# Patient Record
Sex: Male | Born: 1975 | Race: White | Hispanic: No | Marital: Single | Smoking: Never smoker
Health system: Southern US, Community
[De-identification: ages and names within clinical notes are randomized; demographics above are authoritative.]

---

## 2020-12-08 ENCOUNTER — Ambulatory Visit (INDEPENDENT_AMBULATORY_CARE_PROVIDER_SITE_OTHER): Payer: 59 | Admitting: Podiatry

## 2020-12-08 ENCOUNTER — Ambulatory Visit (INDEPENDENT_AMBULATORY_CARE_PROVIDER_SITE_OTHER): Payer: 59

## 2020-12-08 ENCOUNTER — Encounter: Payer: Self-pay | Admitting: Podiatry

## 2020-12-08 ENCOUNTER — Other Ambulatory Visit: Payer: Self-pay

## 2020-12-08 DIAGNOSIS — M21619 Bunion of unspecified foot: Secondary | ICD-10-CM | POA: Diagnosis not present

## 2020-12-08 DIAGNOSIS — M79671 Pain in right foot: Secondary | ICD-10-CM

## 2020-12-08 DIAGNOSIS — M7672 Peroneal tendinitis, left leg: Secondary | ICD-10-CM | POA: Diagnosis not present

## 2020-12-08 DIAGNOSIS — M25572 Pain in left ankle and joints of left foot: Secondary | ICD-10-CM

## 2020-12-08 DIAGNOSIS — M79672 Pain in left foot: Secondary | ICD-10-CM

## 2020-12-08 MED ORDER — TRIAMCINOLONE ACETONIDE 10 MG/ML IJ SUSP
10.0000 mg | Freq: Once | INTRAMUSCULAR | Status: AC
  Administered 2020-12-08: 10 mg

## 2020-12-11 NOTE — Progress Notes (Signed)
Subjective:   Patient ID: Casey Vaughan, male   DOB: 45 y.o.   MRN: [id number]   HPI Patient states that he injured his left ankle approximately 1 year ago and that it is gotten worse over the last couple months but is lingered since then.  Also states that he seems to get discomfort around his big toe joint and that its been somewhat present for the last 3 to 4 years.  It is somewhat vague in its intensity and patient does not smoke likes to be active   Review of Systems  All other systems reviewed and are negative.       Objective:  Physical Exam Vitals and nursing note reviewed.  Constitutional:      Appearance: He is well-developed and well-nourished.  Cardiovascular:     Pulses: Intact distal pulses.  Pulmonary:     Effort: Pulmonary effort is normal.  Musculoskeletal:        General: Normal range of motion.  Skin:    General: Skin is warm.  Neurological:     Mental Status: He is alert.   Neurovascular status found to be intact muscle strength was adequate range of motion adequate with patient noted to have inflammation lateral side left foot base of fifth metatarsal with fluid buildup around the insertion with no restriction motion first MPJ bilateral mild bunion deformity bilateral.  Patient is found to have good digital perfusion well oriented x3      Assessment:  Peroneal tendinitis left along with mild structural bunion deformity bilateral with chronic foot pain     Plan:  H&P condition reviewed and at this point we will get a focus on the acute inflammation lateral side left foot.  I did sterile prep and I injected the peroneal tendon insertion 3 mg Dexasone Kenalog 5 mg Xylocaine applied fascial brace to lift up the lateral foot gave instructions for ice and anti-inflammatories the patient will be seen back to recheck  X-rays were negative for signs of fracture did not indicate bone pathology at this time appears soft tissue with no indications of first MPJ arthritis  with mild bunion deformity bilateral

## 2021-01-08 ENCOUNTER — Encounter (HOSPITAL_COMMUNITY): Payer: Self-pay

## 2021-01-08 ENCOUNTER — Emergency Department (HOSPITAL_COMMUNITY): Payer: 59

## 2021-01-08 ENCOUNTER — Emergency Department (HOSPITAL_COMMUNITY)
Admission: EM | Admit: 2021-01-08 | Discharge: 2021-01-08 | Disposition: A | Discharge status: Home / Self Care | Payer: 59 | Attending: Emergency Medicine | Admitting: Emergency Medicine

## 2021-01-08 ENCOUNTER — Other Ambulatory Visit: Payer: Self-pay

## 2021-01-08 DIAGNOSIS — F419 Anxiety disorder, unspecified: Secondary | ICD-10-CM | POA: Insufficient documentation

## 2021-01-08 DIAGNOSIS — R079 Chest pain, unspecified: Secondary | ICD-10-CM | POA: Diagnosis present

## 2021-01-08 DIAGNOSIS — R0789 Other chest pain: Secondary | ICD-10-CM | POA: Insufficient documentation

## 2021-01-08 DIAGNOSIS — R0602 Shortness of breath: Secondary | ICD-10-CM | POA: Insufficient documentation

## 2021-01-08 DIAGNOSIS — R002 Palpitations: Secondary | ICD-10-CM | POA: Insufficient documentation

## 2021-01-08 LAB — HEPATIC FUNCTION PANEL
ALT: 49 U/L — ABNORMAL HIGH (ref 0–44)
AST: 30 U/L (ref 15–41)
Albumin: 3.9 g/dL (ref 3.5–5.0)
Alkaline Phosphatase: 91 U/L (ref 38–126)
Bilirubin, Direct: 0.1 mg/dL (ref 0.0–0.2)
Indirect Bilirubin: 0.7 mg/dL (ref 0.3–0.9)
Total Bilirubin: 0.8 mg/dL (ref 0.3–1.2)
Total Protein: 6.9 g/dL (ref 6.5–8.1)

## 2021-01-08 LAB — BASIC METABOLIC PANEL
Anion gap: 7 (ref 5–15)
BUN: 10 mg/dL (ref 6–20)
CO2: 26 mmol/L (ref 22–32)
Calcium: 9.3 mg/dL (ref 8.9–10.3)
Chloride: 102 mmol/L (ref 98–111)
Creatinine, Ser: 0.86 mg/dL (ref 0.61–1.24)
GFR, Estimated: >60 mL/min (ref >60)
Glucose, Bld: 176 mg/dL — ABNORMAL HIGH (ref 70–99)
Potassium: 3.3 mmol/L — ABNORMAL LOW (ref 3.5–5.1)
Sodium: 135 mmol/L (ref 135–145)

## 2021-01-08 LAB — CBC
HCT: 42.9 % (ref 39.0–52.0)
Hemoglobin: 14.7 g/dL (ref 13.0–17.0)
MCH: 29.9 pg (ref 26.0–34.0)
MCHC: 34.3 g/dL (ref 30.0–36.0)
MCV: 87.2 fL (ref 80.0–100.0)
Platelets: 257 10*3/uL (ref 150–400)
RBC: 4.92 MIL/uL (ref 4.22–5.81)
RDW: 12.7 % (ref 11.5–15.5)
WBC: 9.6 10*3/uL (ref 4.0–10.5)
nRBC: 0 % (ref 0.0–0.2)

## 2021-01-08 LAB — LIPASE, BLOOD: Lipase: 31 U/L (ref 11–51)

## 2021-01-08 LAB — TROPONIN I (HIGH SENSITIVITY)
Troponin I (High Sensitivity): 3 ng/L (ref <18)
Troponin I (High Sensitivity): 3 ng/L (ref <18)

## 2021-01-08 LAB — D-DIMER, QUANTITATIVE: D-Dimer, Quant: <0.27 ug/mL-FEU (ref 0.00–0.50)

## 2021-01-08 MED ORDER — HYDROXYZINE HCL 25 MG PO TABS
25.0000 mg | ORAL_TABLET | Freq: Once | ORAL | Status: AC
  Administered 2021-01-08: 25 mg via ORAL
  Filled 2021-01-08: qty 1

## 2021-01-08 MED ORDER — DOXYCYCLINE HYCLATE 100 MG PO CAPS: 100.0000 mg | ORAL_CAPSULE | Freq: Two times a day (BID) | ORAL | 0 refills | Status: AC

## 2021-01-08 MED ORDER — DOXYCYCLINE HYCLATE 100 MG PO TABS
100.0000 mg | ORAL_TABLET | Freq: Once | ORAL | Status: AC
  Administered 2021-01-08: 100 mg via ORAL
  Filled 2021-01-08: qty 1

## 2021-01-08 MED ORDER — HYDROXYZINE HCL 25 MG PO TABS: 25.0000 mg | ORAL_TABLET | Freq: Three times a day (TID) | ORAL | 0 refills | Status: AC | PRN

## 2021-01-08 NOTE — ED Provider Notes (Signed)
Center from Dr. Manus Gunning.  45 year old male here with anxiety palpitations and chest pain.  Plan is to follow-up on delta troponin and LFTs. Physical Exam  BP 137/82 (BP Location: Left Arm)   Pulse 77   Temp 98.3 F (36.8 C) (Oral)   Resp 19   Ht 5\' 9"  (1.753 m)   Wt 77.1 kg   SpO2 100%   BMI 25.10 kg/m   Physical Exam  ED Course/Procedures     Procedures  MDM  Second troponin not rising and LFTs fairly unremarkable.  Will proceed with discharge per Dr. plan.      Randel Books, MD 01/08/21 [phone]

## 2021-01-08 NOTE — ED Triage Notes (Signed)
Patient reports anxiety for the last 3 days, palpitations and epigastric pain

## 2021-01-08 NOTE — Discharge Instructions (Signed)
There is no evidence of heart attack or blood clot in the lung.  He should follow-up with your primary doctor for a stress test.  Return to the ED if your chest becomes exertional, worse with breathing, associated with nausea, vomiting or sweating or any concerns. In terms of your anxiety you may choose a counselor from the attached list.  Return to the ED if you develop suicidal thoughts

## 2021-01-08 NOTE — ED Provider Notes (Signed)
[facility] EMERGENCY DEPARTMENT Provider Note   CSN: [id number] Arrival date & time: 01/08/21  7989     History Chief Complaint  Patient presents with  . Anxiety  . Chest Pain    Casey Vaughan is a 45 y.o. male.  Patient with a history of depression on BuSpar here with what he thinks is anxiety.  States for the past several days has had increased stress at home with his girlfriend recently diagnosed with mental health issues and difficulty with children at home.  He states he feels his heart racing associated with some dull tightness and pain in the center of his chest.  This is associated with some shortness of breath.  No nausea or vomiting.  Pain last for several minutes to hours at a time.  He thinks "thoughts" make the pain worse and not thinking about stress makes the pain better.  The pain is not exertional or pleuritic.  There is no cough or fever. He denies smoking.  No history of hypertension or diabetes.  He denies any suicidal thoughts or homicidal thoughts.  No hallucinations. No abdominal pain, pain with urination, blood in the urine. He is very tearful and states the pain gets worse when he thinks about the stress he has at home. Denies any cardiac history  The history is provided by the patient.  Anxiety Associated symptoms include chest pain and shortness of breath. Pertinent negatives include no abdominal pain.  Chest Pain Associated symptoms: anxiety and shortness of breath   Associated symptoms: no abdominal pain, no fever, no nausea and no vomiting        History reviewed. No pertinent past medical history.  There are no problems to display for this patient.   History reviewed. No pertinent surgical history.     History reviewed. No pertinent family history.  Social History   Tobacco Use  . Smoking status: Never Smoker  . Smokeless tobacco: Never Used  Substance Use Topics  . Alcohol use: Yes  . Drug use: Never    Home  Medications Prior to Admission medications   Medication Sig Start Date End Date Taking? Authorizing Provider  buPROPion (WELLBUTRIN) 75 MG tablet Take by mouth. 11/29/20   [provider]    Allergies    Patient has no known allergies.  Review of Systems   Review of Systems  Constitutional: Negative for activity change, appetite change and fever.  HENT: Negative for congestion.   Respiratory: Positive for chest tightness and shortness of breath. Negative for apnea and choking.   Cardiovascular: Positive for chest pain.  Gastrointestinal: Negative for abdominal pain, nausea and vomiting.  Genitourinary: Negative for dysuria and hematuria.  Musculoskeletal: Negative for arthralgias and myalgias.  Skin: Negative for rash.  Psychiatric/Behavioral: Positive for decreased concentration, dysphoric mood and sleep disturbance. Negative for suicidal ideas. The patient is nervous/anxious.    all other systems are negative except as noted in the HPI and PMH.    Physical Exam Updated Vital Signs BP 137/82 (BP Location: Left Arm)   Pulse 77   Temp 98.3 F (36.8 C) (Oral)   Resp 19   Ht 5\' 9"  (1.753 m)   Wt 77.1 kg   SpO2 100%   BMI 25.10 kg/m   Physical Exam Vitals and nursing note reviewed.  Constitutional:      General: He is not in acute distress.    Appearance: He is well-developed.     Comments: Anxious and tearful  HENT:  Head: Normocephalic and atraumatic.     Mouth/Throat:     Pharynx: No oropharyngeal exudate.  Eyes:     Conjunctiva/sclera: Conjunctivae normal.     Pupils: Pupils are equal, round, and reactive to light.  Neck:     Comments: No meningismus. Cardiovascular:     Rate and Rhythm: Normal rate and regular rhythm.     Heart sounds: Normal heart sounds. No murmur heard.   Pulmonary:     Effort: Pulmonary effort is normal. No respiratory distress.     Breath sounds: Normal breath sounds.  Abdominal:     Palpations: Abdomen is soft.      Tenderness: There is no abdominal tenderness. There is no guarding or rebound.  Musculoskeletal:        General: No tenderness. Normal range of motion.     Cervical back: Normal range of motion and neck supple.  Skin:    General: Skin is warm.  Neurological:     Mental Status: He is alert and oriented to person, place, and time.     Cranial Nerves: No cranial nerve deficit.     Motor: No abnormal muscle tone.     Coordination: Coordination normal.     Comments: No ataxia on finger to nose bilaterally. No pronator drift. 5/5 strength throughout. CN 2-12 intact.Equal grip strength. Sensation intact.   Psychiatric:        Behavior: Behavior normal.     ED Results / Procedures / Treatments   Labs (all labs ordered are listed, but only abnormal results are displayed) Labs Reviewed  BASIC METABOLIC PANEL - Abnormal; Notable for the following components:      Result Value   Potassium 3.3 (*)    Glucose, Bld 176 (*)    All other components within normal limits  CULTURE, BLOOD (ROUTINE X 2)  CULTURE, BLOOD (ROUTINE X 2)  CBC  D-DIMER, QUANTITATIVE  HEPATIC FUNCTION PANEL  LIPASE, BLOOD  TROPONIN I (HIGH SENSITIVITY)  TROPONIN I (HIGH SENSITIVITY)    EKG EKG Interpretation  Date/Time:  Sunday January 08 2021 03:27:19 EDT Ventricular Rate:  89 PR Interval:  142 QRS Duration: 98 QT Interval:  352 QTC Calculation: 428 R Axis:   72 Text Interpretation: Normal sinus rhythm Minimal voltage criteria for LVH, may be normal variant ( Sokolow-Lyon ) Borderline ECG No previous ECGs available Confirmed by Glynn Octave [phone]) on 01/08/2021 5:03:06 AM   Radiology DG Chest 2 View  Result Date: 01/08/2021 CLINICAL DATA:  45 year old male with chest pain shortness of breath, palpitations, epigastric pain. EXAM: CHEST - 2 VIEW COMPARISON:  None. FINDINGS: Asymmetrically decreased volume in the right lung with streaky and nodular right upper lobe opacity. Mediastinal contours remain within  normal limits, the right hilum is slightly elevated. No superimposed pneumothorax, pleural effusion or pulmonary edema. There is subtle reticulonodular opacity also in the left upper lung which elsewhere appears clear. Visualized tracheal air column is within normal limits. No acute osseous abnormality identified. Negative visible bowel gas. IMPRESSION: Abnormal pulmonary upper lobes greater on the right. Patchy and nodular right upper lobe opacity with right lung volume loss. Favor acute on chronic upper lobe infection. There are subtle reticulonodular changes in the left upper lobe. Mediastinal contours remain normal and there is no pleural effusion. Electronically Signed   By: Odessa Fleming M.D.   On: 01/08/2021 04:50    Procedures Procedures   Medications Ordered in ED Medications  hydrOXYzine (ATARAX/VISTARIL) tablet 25 mg (has no administration in time  range)    ED Course  I have reviewed the triage vital signs and the nursing notes.  Pertinent labs & imaging results that were available during my care of the patient were reviewed by me and considered in my medical decision making (see chart for details).    MDM Rules/Calculators/A&P                         Intermittent palpitations, chest tightness and shortness of breath for the several days associated with anxious thoughts.  EKG is sinus rhythm with LVH.  No comparison.  No ST elevations.  Chest x-ray shows patchy abnormalities in upper lobes.  No comparison.  Discussed with patient.  He has had no recent cough or fever.  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  He states this was fully treated.  He was told he would have sequelae on his x-ray.  Low suspicion for ACS.  Heart score is 1. D-dimer negative.   He will need follow-up for a stress test and is also given a list of resources for counselors in regard to his anxiety.  He is not suicidal. Second troponin pending at time of sign out. Expect discharge  home with outpatient followup for stress test.   Return to the ED if chest pain becomes exertional or associated shortness of breath, nausea, vomiting, diaphoresis or other concerns Final Clinical Impression(s) / ED Diagnoses Final diagnoses:  Atypical chest pain  Anxiety    Rx / DC Orders ED Discharge Orders    None       Rancour, Jeannett Senior, MD 01/08/21 0710

## 2021-01-13 LAB — CULTURE, BLOOD (ROUTINE X 2)
Culture: NO GROWTH
Culture: NO GROWTH
Special Requests: ADEQUATE

## 2021-02-22 ENCOUNTER — Other Ambulatory Visit: Payer: Self-pay

## 2021-02-22 ENCOUNTER — Ambulatory Visit (INDEPENDENT_AMBULATORY_CARE_PROVIDER_SITE_OTHER): Payer: 59 | Admitting: Podiatry

## 2021-02-22 ENCOUNTER — Encounter: Payer: Self-pay | Admitting: Podiatry

## 2021-02-22 DIAGNOSIS — M7672 Peroneal tendinitis, left leg: Secondary | ICD-10-CM

## 2021-02-22 DIAGNOSIS — M21619 Bunion of unspecified foot: Secondary | ICD-10-CM | POA: Diagnosis not present

## 2021-02-22 DIAGNOSIS — M25572 Pain in left ankle and joints of left foot: Secondary | ICD-10-CM

## 2021-02-22 NOTE — Progress Notes (Signed)
Subjective:   Patient ID: Casey Vaughan, male   DOB: 45 y.o.   MRN: [id number]   HPI Patient presents stating the outside of my ankle is feeling quite a bit better with mild discomfort still noted and I do get pain around my big toe joints of both feet which is bothersome and I am not sure what is causing   ROS      Objective:  Physical Exam  Neurovascular status intact with pain found to be around the first metatarsal head bilateral moderate discomfort when palpated localized with discomfort of the outside left foot which is improved and is only mildly sore     Assessment:  Appears to be structural deformity around the first metatarsal head with moderate bunion no indications of arthritis or loss of range of motion      Plan:  H&P condition reviewed and I went ahead today and I discussed structural correction which can be done and can be done in the future.  We will get a hold off on this and he will will utilize wider shoes soft materials and stretching exercises.  Reappoint as symptoms indicate and recommended ice and topical medicines for the outside of the left foot and to be seen back as needed for that

## 2022-02-18 IMAGING — CR DG CHEST 2V
2 series · 2 of 2 positions shown · non-contrast
Comparison: None.

CLINICAL DATA: 44-year-old male with chest pain shortness of
breath, palpitations, epigastric pain.

EXAM:
CHEST - 2 VIEW

[chest pa]
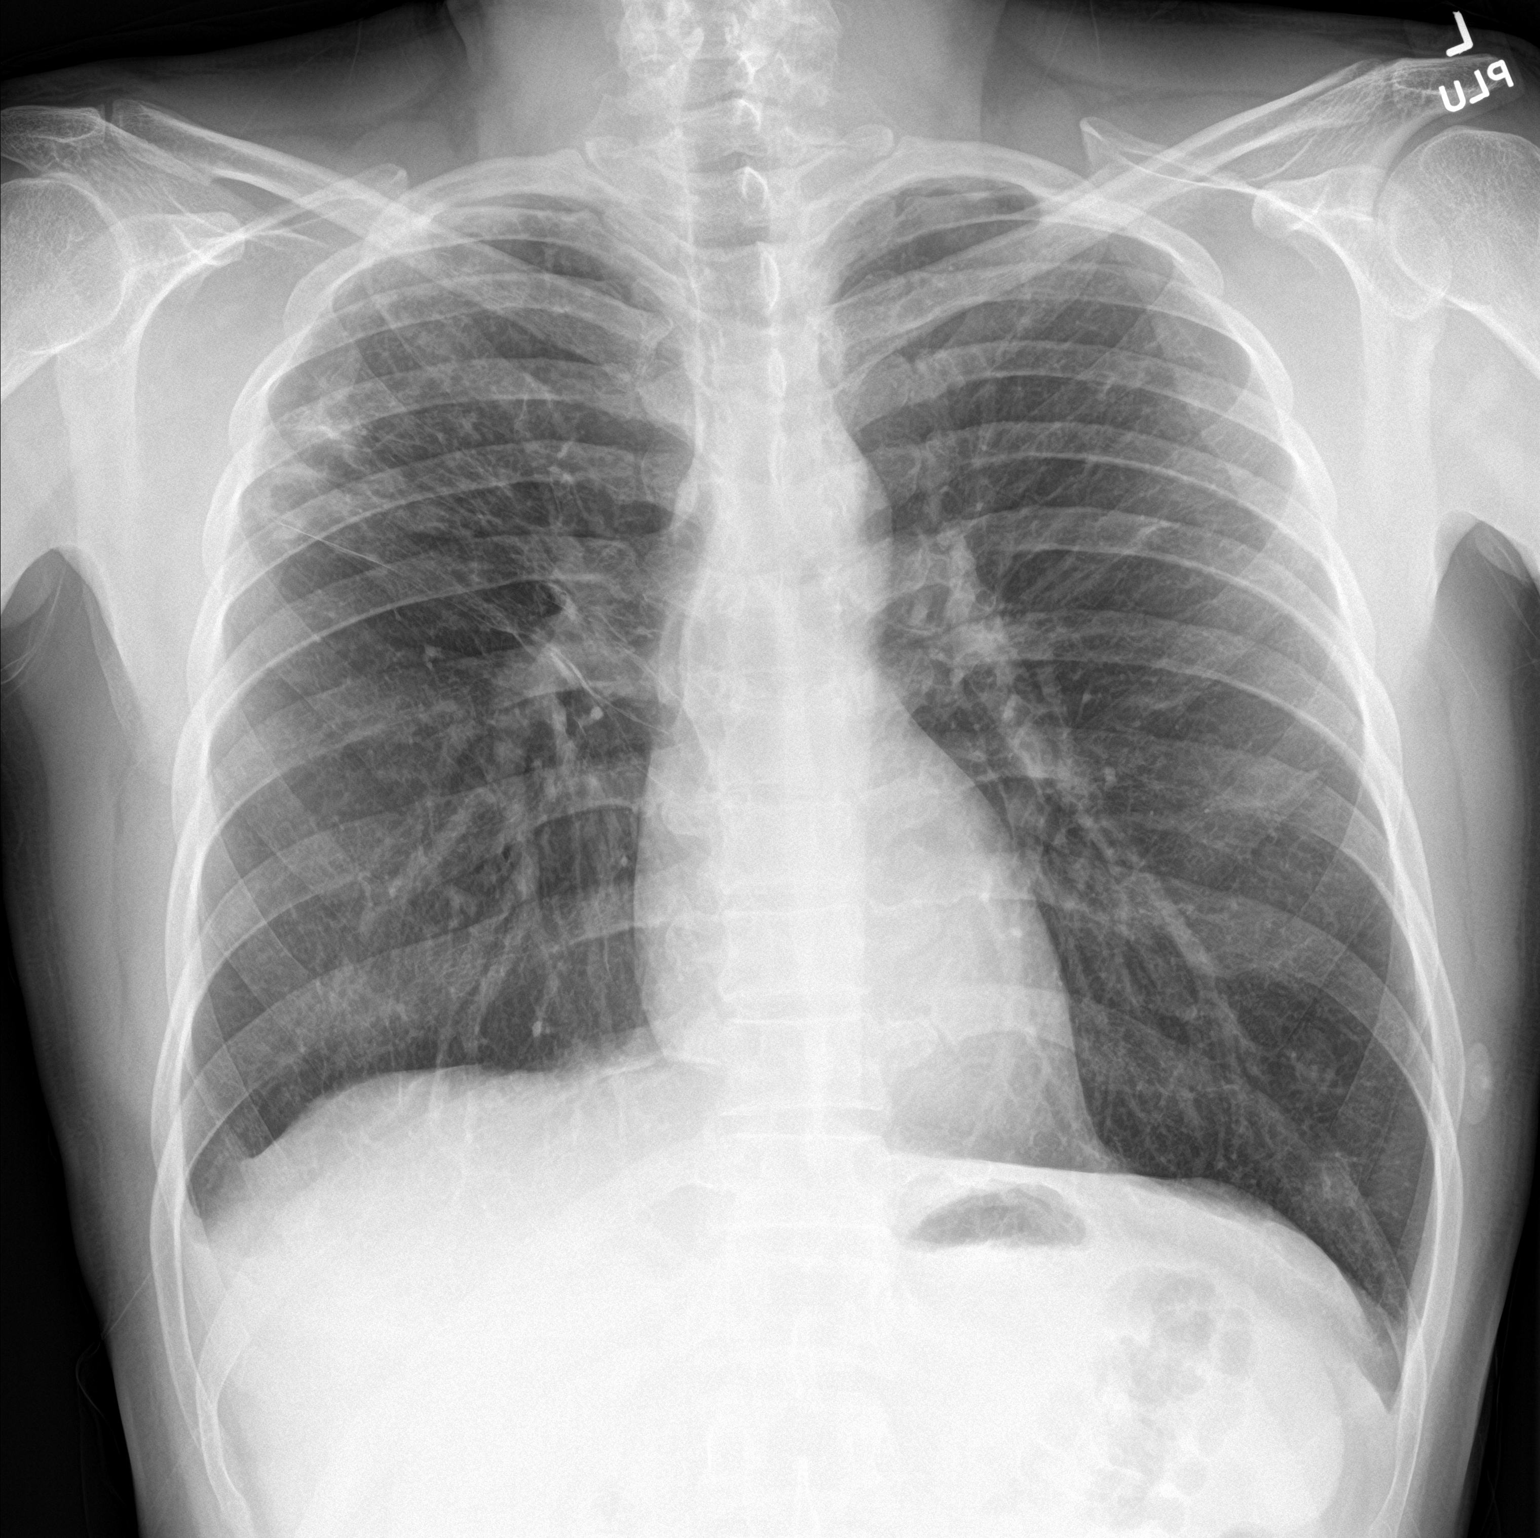

[chest lat]
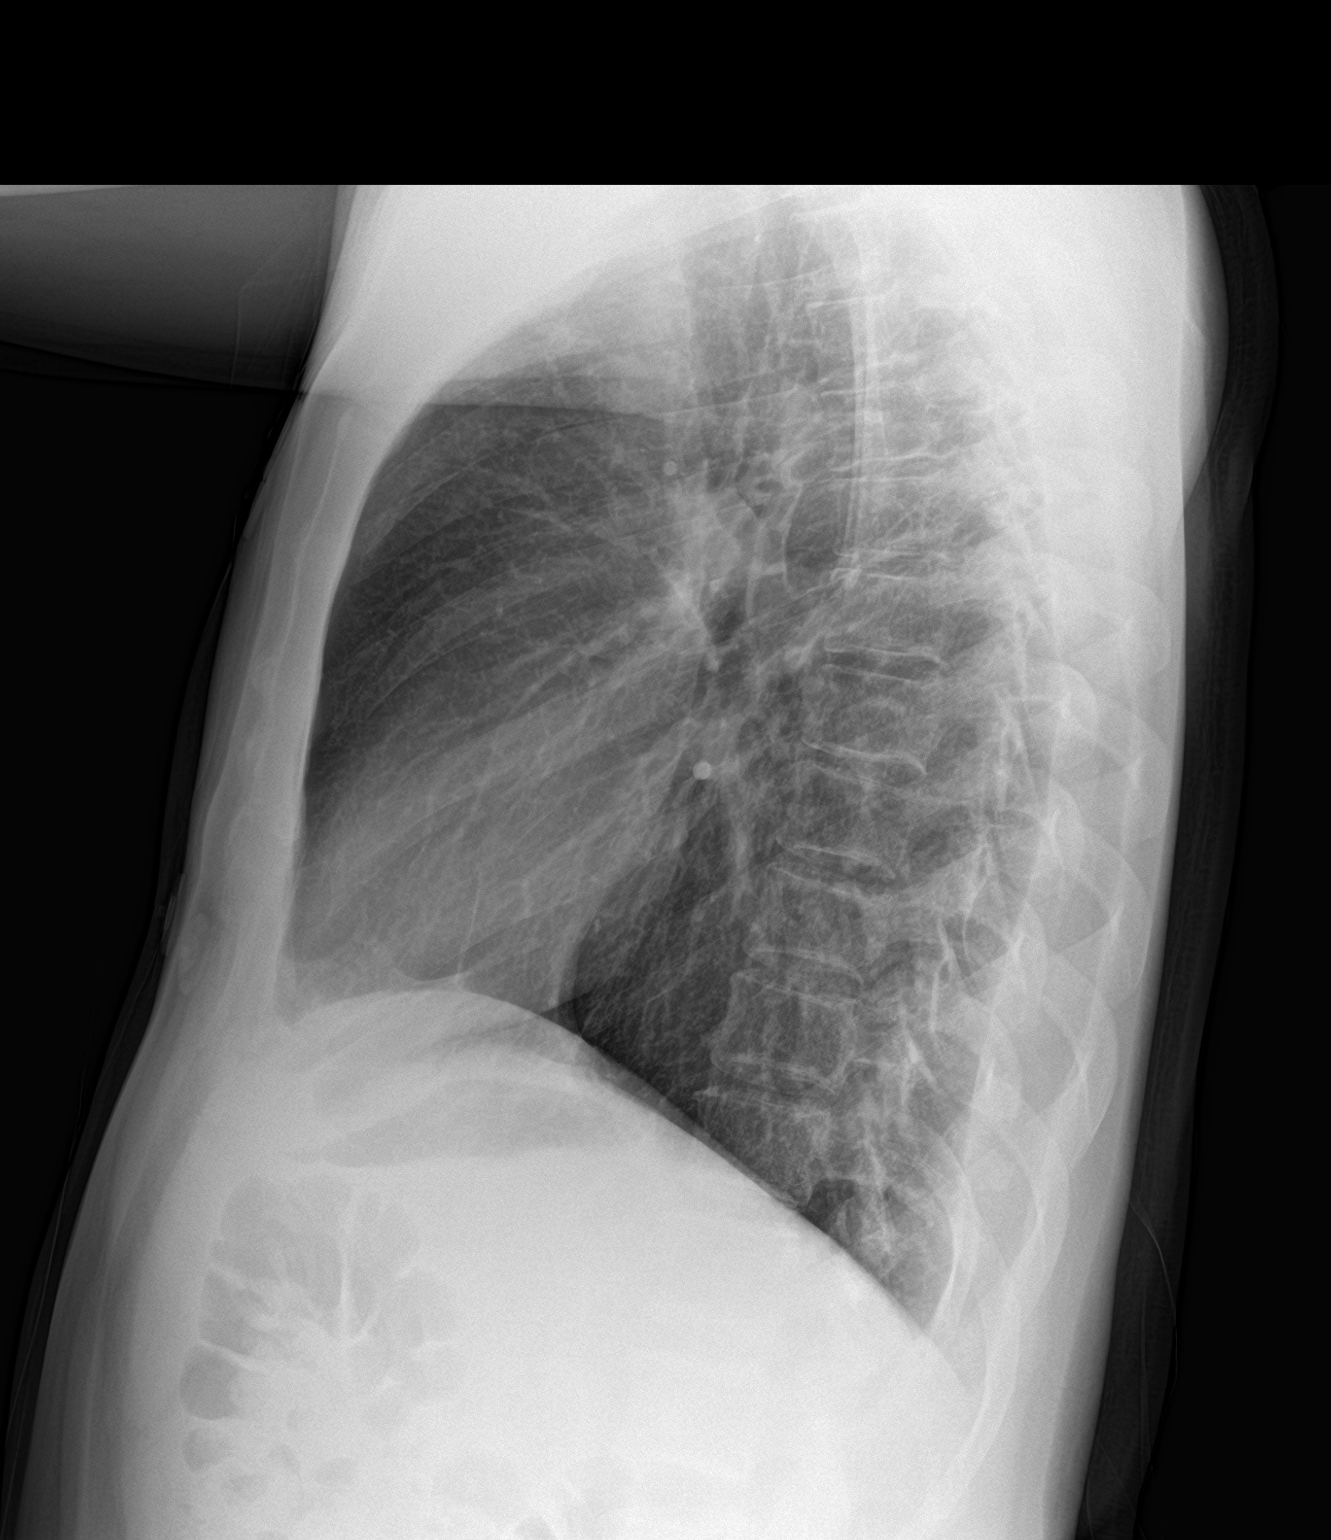

[2 of 2 positions shown; findings below may reference images not displayed]

FINDINGS: Asymmetrically decreased volume in the right lung with streaky and
nodular right upper lobe opacity. Mediastinal contours remain within
normal limits, the right hilum is slightly elevated. No superimposed
pneumothorax, pleural effusion or pulmonary edema. There is subtle
reticulonodular opacity also in the left upper lung which elsewhere
appears clear. Visualized tracheal air column is within normal
limits. No acute osseous abnormality identified. Negative visible
bowel gas.
IMPRESSION: Abnormal pulmonary upper lobes greater on the right. Patchy and
nodular right upper lobe opacity with right lung volume loss.
Favor acute on chronic upper lobe infection.
There are subtle reticulonodular changes in the left upper lobe.
Mediastinal contours remain normal and there is no pleural effusion.
